# Patient Record
Sex: Female | Born: 1940 | Race: White | Hispanic: No | Marital: Married | Smoking: Never smoker
Health system: Southern US, Community
[De-identification: ages and names within clinical notes are randomized; demographics above are authoritative.]

## PROBLEM LIST (undated history)

## (undated) DIAGNOSIS — I10 Essential (primary) hypertension: Secondary | ICD-10-CM

## (undated) DIAGNOSIS — J45909 Unspecified asthma, uncomplicated: Secondary | ICD-10-CM

## (undated) HISTORY — PX: REPLACEMENT TOTAL KNEE: SUR1224

---

## 2008-08-19 ENCOUNTER — Ambulatory Visit: Payer: Self-pay | Admitting: Diagnostic Radiology

## 2008-08-19 ENCOUNTER — Emergency Department (HOSPITAL_BASED_OUTPATIENT_CLINIC_OR_DEPARTMENT_OTHER): Admission: EM | Admit: 2008-08-19 | Discharge: 2008-08-19 | Payer: Self-pay | Admitting: Emergency Medicine

## 2013-07-11 ENCOUNTER — Encounter (HOSPITAL_BASED_OUTPATIENT_CLINIC_OR_DEPARTMENT_OTHER): Payer: Self-pay | Admitting: Emergency Medicine

## 2013-07-11 ENCOUNTER — Emergency Department (HOSPITAL_BASED_OUTPATIENT_CLINIC_OR_DEPARTMENT_OTHER): Payer: Medicare Other

## 2013-07-11 ENCOUNTER — Emergency Department (HOSPITAL_BASED_OUTPATIENT_CLINIC_OR_DEPARTMENT_OTHER)
Admission: EM | Admit: 2013-07-11 | Discharge: 2013-07-11 | Disposition: A | Discharge status: Home / Self Care | Payer: Medicare Other | Attending: Emergency Medicine | Admitting: Emergency Medicine

## 2013-07-11 DIAGNOSIS — J45901 Unspecified asthma with (acute) exacerbation: Secondary | ICD-10-CM | POA: Insufficient documentation

## 2013-07-11 DIAGNOSIS — Z79899 Other long term (current) drug therapy: Secondary | ICD-10-CM | POA: Insufficient documentation

## 2013-07-11 DIAGNOSIS — J45909 Unspecified asthma, uncomplicated: Secondary | ICD-10-CM

## 2013-07-11 DIAGNOSIS — I10 Essential (primary) hypertension: Secondary | ICD-10-CM | POA: Insufficient documentation

## 2013-07-11 DIAGNOSIS — J189 Pneumonia, unspecified organism: Secondary | ICD-10-CM

## 2013-07-11 DIAGNOSIS — J159 Unspecified bacterial pneumonia: Secondary | ICD-10-CM | POA: Insufficient documentation

## 2013-07-11 DIAGNOSIS — Z880 Allergy status to penicillin: Secondary | ICD-10-CM | POA: Insufficient documentation

## 2013-07-11 HISTORY — DX: Essential (primary) hypertension: I10

## 2013-07-11 HISTORY — DX: Unspecified asthma, uncomplicated: J45.909

## 2013-07-11 MED ORDER — AZITHROMYCIN 250 MG PO TABS
500.0000 mg | ORAL_TABLET | Freq: Once | ORAL | Status: AC
  Administered 2013-07-11: 500 mg via ORAL
  Filled 2013-07-11: qty 2

## 2013-07-11 MED ORDER — IPRATROPIUM-ALBUTEROL 0.5-2.5 (3) MG/3ML IN SOLN
3.0000 mL | Freq: Once | RESPIRATORY_TRACT | Status: AC
Start: 2013-07-11 — End: 2013-07-11
  Administered 2013-07-11: 3 mL via RESPIRATORY_TRACT
  Filled 2013-07-11: qty 3

## 2013-07-11 MED ORDER — AZITHROMYCIN 250 MG PO TABS
250.0000 mg | ORAL_TABLET | Freq: Every day | ORAL | Status: AC
Start: 2013-07-11 — End: ?

## 2013-07-11 MED ORDER — ALBUTEROL SULFATE HFA 108 (90 BASE) MCG/ACT IN AERS
2.0000 | INHALATION_SPRAY | RESPIRATORY_TRACT | Status: DC | PRN
  Administered 2013-07-11: 2 via RESPIRATORY_TRACT
  Filled 2013-07-11: qty 6.7

## 2013-07-11 MED ORDER — ACETAMINOPHEN 325 MG PO TABS
ORAL_TABLET | ORAL | Status: AC
  Administered 2013-07-11: 650 mg
  Filled 2013-07-11: qty 2

## 2013-07-11 MED ORDER — ALBUTEROL SULFATE (2.5 MG/3ML) 0.083% IN NEBU: 5.0000 mg | INHALATION_SOLUTION | Freq: Once | RESPIRATORY_TRACT | Status: DC

## 2013-07-11 MED ORDER — ALBUTEROL SULFATE (2.5 MG/3ML) 0.083% IN NEBU
2.5000 mg | INHALATION_SOLUTION | Freq: Once | RESPIRATORY_TRACT | Status: AC
  Administered 2013-07-11: 2.5 mg via RESPIRATORY_TRACT
  Filled 2013-07-11: qty 3

## 2013-07-11 MED ORDER — IPRATROPIUM BROMIDE 0.02 % IN SOLN: 0.5000 mg | Freq: Once | RESPIRATORY_TRACT | Status: DC

## 2013-07-11 MED ORDER — IPRATROPIUM-ALBUTEROL 0.5-2.5 (3) MG/3ML IN SOLN: 3.0000 mL | RESPIRATORY_TRACT | Status: DC

## 2013-07-11 MED ORDER — PREDNISONE 10 MG PO TABS: 20.0000 mg | ORAL_TABLET | Freq: Every day | ORAL | Status: AC

## 2013-07-11 MED ORDER — AEROCHAMBER PLUS FLO-VU MEDIUM MISC
1.0000 | Freq: Once | Status: AC
  Administered 2013-07-11: 1
  Filled 2013-07-11: qty 1

## 2013-07-11 MED ORDER — PREDNISONE 50 MG PO TABS
60.0000 mg | ORAL_TABLET | Freq: Once | ORAL | Status: AC
  Administered 2013-07-11: 60 mg via ORAL
  Filled 2013-07-11 (×2): qty 1

## 2013-07-11 MED ORDER — ALBUTEROL SULFATE (2.5 MG/3ML) 0.083% IN NEBU
5.0000 mg | INHALATION_SOLUTION | Freq: Once | RESPIRATORY_TRACT | Status: AC
  Administered 2013-07-11: 5 mg via RESPIRATORY_TRACT
  Filled 2013-07-11: qty 6

## 2013-07-11 NOTE — Progress Notes (Signed)
Patient states that she is breathing better post nebulizer treatment but is still having some difficulty taking a deep breath.

## 2013-07-11 NOTE — ED Notes (Signed)
RT at triage to administer treatment

## 2013-07-11 NOTE — ED Provider Notes (Signed)
CSN: [id number]     Arrival date & time 07/11/13  1908 History   First MD Initiated Contact with Patient 07/11/13 2100    This chart was scribed for Hilario Quarry, MD by Ladona Ridgel Day, ED scribe. This patient was seen in room MH05/MH05 and the patient's care was started at 2100.  Chief Complaint  Patient presents with  . Asthma  . Cough   The history is provided by the patient. No language interpreter was used.   HPI Comments: Kristen Rhodes is a 73 y.o. female who presents to the Emergency Department complaining of asthma flare up and cough, onset this AM. She had been using her inhaler at home past several days and (about 3 times/day) and today began w/more wheezing and SOB. She reports associated non-productive cough, mild dyspnea. No sick contacts. No fever/chills, emesis episodes or diarrhea. No recent antibiotic or steroid use. Has never been hospitalized for her asthma.  Past Medical History  Diagnosis Date  . Asthma   . Hypertension    History reviewed. No pertinent past surgical history. No family history on file. History  Substance Use Topics  . Smoking status: Never Smoker   . Smokeless tobacco: Not on file  . Alcohol Use: Not on file   OB History   Grav Para Term Preterm Abortions TAB SAB Ect Mult Living                 Review of Systems  Constitutional: Negative for fever and chills.  Respiratory: Positive for cough, shortness of breath and wheezing.   Cardiovascular: Negative for chest pain.  Gastrointestinal: Negative for abdominal pain.  Musculoskeletal: Negative for back pain.  All other systems reviewed and are negative.   A complete 10 system review of systems was obtained and all systems are negative except as noted in the HPI and PMH.   Allergies  Amoxicillin  Home Medications   Current Outpatient Rx  Name  Route  Sig  Dispense  Refill  . albuterol (PROVENTIL HFA;VENTOLIN HFA) 108 (90 BASE) MCG/ACT inhaler   Inhalation   Inhale into the lungs every  6 (six) hours as needed for wheezing or shortness of breath.         . lisinopril (PRINIVIL,ZESTRIL) 20 MG tablet   Oral   Take 20 mg by mouth daily.         . rosuvastatin (CRESTOR) 40 MG tablet   Oral   Take 40 mg by mouth daily.          Triage Vitals: BP 150/76  Pulse 108  Temp(Src) 97.4 F (36.3 C) (Oral)  Resp 20  Ht 5' 2.5" (1.588 m)  Wt 120 lb (54.432 kg)  BMI 21.59 kg/m2  SpO2 95% Physical Exam  Nursing note and vitals reviewed. Constitutional: She is oriented to person, place, and time. She appears well-developed and well-nourished. No distress.  HENT:  Head: Normocephalic and atraumatic.  Eyes: Conjunctivae are normal. Right eye exhibits no discharge. Left eye exhibits no discharge.  Neck: Normal range of motion.  Cardiovascular: Normal rate, regular rhythm and normal heart sounds.   No murmur heard. Pulmonary/Chest: Effort normal. No respiratory distress. She has wheezes.  Expiratory wheezing  Musculoskeletal: Normal range of motion. She exhibits no edema.  Neurological: She is alert and oriented to person, place, and time.  Skin: Skin is warm and dry.  Psychiatric: She has a normal mood and affect. Thought content normal.   ED Course  Procedures (including critical care time)  DIAGNOSTIC STUDIES: Oxygen Saturation is 98% on room air, normal by my interpretation.    COORDINATION OF CARE: At 900 PM Discussed treatment plan with patient which includes CXR, breathing tx. Patient agrees.   Labs Review Labs Reviewed - No data to display Imaging Review Dg Chest 2 View  07/11/2013   CLINICAL DATA:  Cough and congestion.  Asthma.  Shortness of breath.  EXAM: CHEST  2 VIEW  COMPARISON:  None.  FINDINGS: A mild pectus excavatum deformity. Left glenohumeral joint osteoarthritis. Midline trachea. Normal heart size and mediastinal contours for age. No pleural effusion or pneumothorax. Mild bibasilar volume loss, without lobar consolidation on the lateral view.   IMPRESSION: Bibasilar left greater than right volume loss. Favor atelectasis. Difficult to exclude early left lower lobe pneumonia. If symptoms warrant, consider short term radiographic followup.   Electronically Signed   By: Jeronimo GreavesKyle  Talbot M.D.   On: 07/11/2013 20:26   EKG Interpretation   None      MDM  73 y.o. Female with h.o. Asthma with intermittent wheezing past week. Patient improved here with nebs and prednisone.  CXR with question of early lll pneumonia.  Plan treatment with prednisone and zithromax.  Patient advised close follow up and given return precautions to come back if any worsened dyspnea or chest pain.   Hilario Quarryanielle S Albina Gosney, MD 07/11/13 2249

## 2013-07-11 NOTE — ED Notes (Signed)
Patient here with cough and congestion since am. History of asthma and now wheezing and feels shortness of breath

## 2013-07-11 NOTE — Discharge Instructions (Signed)
Please take all medicine as prescribed.  Return if you are worse at any time especially difficulty breathing or worsening shortness of breath.  You are being treated for a possible early pneumonia seen by the radiologist on your chest x-Abdelrahman Nair and should be rechecked by your doctor in the next one - two days.   Asthma, Adult Asthma is a condition of the lungs in which the airways tighten and narrow. Asthma can make it hard to breathe. Asthma cannot be cured, but medicine and lifestyle changes can help control it. Asthma may be started (triggered) by:  Animal skin flakes (dander).  Dust.  Cockroaches.  Pollen.  Mold.  Smoke.  Cleaning products.  Hair sprays or aerosol sprays.  Paint fumes or strong smells.  Cold air, weather changes, and winds.  Crying or laughing hard.  Stress.  Certain medicines or drugs.  Foods, such as dried fruit, potato chips, and sparkling grape juice.  Infections or conditions (colds, flu).  Exercise.  Certain medical conditions or diseases.  Exercise or tiring activities. HOME CARE   Take medicine as told by your doctor.  Use a peak flow meter as told by your doctor. A peak flow meter is a tool that measures how well the lungs are working.  Record and keep track of the peak flow meter's readings.  Understand and use the asthma action plan. An asthma action plan is a written plan for taking care of your asthma and treating your attacks.  To help prevent asthma attacks:  Do not smoke. Stay away from secondhand smoke.  Change your heating and air conditioning filter often.  Limit your use of fireplaces and wood stoves.  Get rid of pests (such as roaches and mice) and their droppings.  Throw away plants if you see mold on them.  Clean your floors. Dust regularly. Use cleaning products that do not smell.  Have someone vacuum when you are not home. Use a vacuum cleaner with a HEPA filter if possible.  Replace carpet with wood, tile, or  vinyl flooring. Carpet can trap animal skin flakes and dust.  Use allergy-proof pillows, mattress covers, and box spring covers.  Wash bed sheets and blankets every week in hot water and dry them in a dryer.  Use blankets that are made of polyester or cotton.  Clean bathrooms and kitchens with bleach. If possible, have someone repaint the walls in these rooms with mold-resistant paint. Keep out of the rooms that are being cleaned and painted.  Wash hands often. GET HELP IF:  You have make a whistling sound when breaking (wheeze), have shortness of breath, or have a cough even if taking medicine to prevent attacks.  The colored mucus you cough up (sputum) is thicker than usual.  The colored mucus you cough up changes from clear or white to yellow, green, gray, or bloody.  You have problems from the medicine you are taking such as:  A rash.  Itching.  Swelling.  Trouble breathing.  You need reliever medicines more than 2 3 times a week.  Your peak flow measurement is still at 50 79% of your personal best after following the action plan for 1 hour. GET HELP RIGHT AWAY IF:   You seem to be worse and are not responding to medicine during an asthma attack.  You are short of breath even at rest.  You get short of breath when doing very little activity.  You have trouble eating, drinking, or talking.  You have chest pain.  You have a fast heartbeat.  Your lips or fingernails start to turn blue.  You are lightheaded, dizzy, or faint.  Your peak flow is less than 50% of your personal best.  You have a fever or lasting symptoms for more than 2 3 days.  You have a fever and your symptoms suddenly get worse. MAKE SURE YOU:   Understand these instructions.  Will watch your condition.  Will get help right away if you are not doing well or get worse. Document Released: 11/27/2007 Document Revised: 03/31/2013 Document Reviewed: 01/07/2013 [facility] Patient Information  2014 [place], [place].

## 2017-09-29 ENCOUNTER — Encounter (HOSPITAL_BASED_OUTPATIENT_CLINIC_OR_DEPARTMENT_OTHER): Payer: Self-pay | Admitting: *Deleted

## 2017-09-29 ENCOUNTER — Other Ambulatory Visit: Payer: Self-pay

## 2017-09-29 ENCOUNTER — Emergency Department (HOSPITAL_BASED_OUTPATIENT_CLINIC_OR_DEPARTMENT_OTHER)
Admission: EM | Admit: 2017-09-29 | Discharge: 2017-09-30 | Disposition: A | Discharge status: Home / Self Care | Payer: Medicare Other | Attending: Emergency Medicine | Admitting: Emergency Medicine

## 2017-09-29 DIAGNOSIS — J45909 Unspecified asthma, uncomplicated: Secondary | ICD-10-CM | POA: Insufficient documentation

## 2017-09-29 DIAGNOSIS — R350 Frequency of micturition: Secondary | ICD-10-CM | POA: Diagnosis present

## 2017-09-29 DIAGNOSIS — Z96651 Presence of right artificial knee joint: Secondary | ICD-10-CM | POA: Diagnosis not present

## 2017-09-29 DIAGNOSIS — R410 Disorientation, unspecified: Secondary | ICD-10-CM | POA: Diagnosis not present

## 2017-09-29 DIAGNOSIS — I10 Essential (primary) hypertension: Secondary | ICD-10-CM | POA: Diagnosis not present

## 2017-09-29 DIAGNOSIS — Z79899 Other long term (current) drug therapy: Secondary | ICD-10-CM | POA: Insufficient documentation

## 2017-09-29 DIAGNOSIS — N3000 Acute cystitis without hematuria: Secondary | ICD-10-CM

## 2017-09-29 LAB — CBC
HEMATOCRIT: 41.5 % (ref 36.0–46.0)
Hemoglobin: 13.3 g/dL (ref 12.0–15.0)
MCH: 29.9 pg (ref 26.0–34.0)
MCHC: 32 g/dL (ref 30.0–36.0)
MCV: 93.3 fL (ref 78.0–100.0)
PLATELETS: 511 10*3/uL — AB (ref 150–400)
RBC: 4.45 MIL/uL (ref 3.87–5.11)
RDW: 15 % (ref 11.5–15.5)
WBC: 6.3 10*3/uL (ref 4.0–10.5)

## 2017-09-29 LAB — BASIC METABOLIC PANEL
ANION GAP: 10 (ref 5–15)
BUN: 16 mg/dL (ref 6–20)
CHLORIDE: 102 mmol/L (ref 101–111)
CO2: 23 mmol/L (ref 22–32)
CREATININE: 0.74 mg/dL (ref 0.44–1.00)
Calcium: 9.5 mg/dL (ref 8.9–10.3)
GFR calc non Af Amer: >60 mL/min (ref >60)
Glucose, Bld: 121 mg/dL — ABNORMAL HIGH (ref 65–99)
POTASSIUM: 3.7 mmol/L (ref 3.5–5.1)
Sodium: 135 mmol/L (ref 135–145)

## 2017-09-29 LAB — I-STAT CG4 LACTIC ACID, ED: Lactic Acid, Venous: 1.46 mmol/L (ref 0.5–1.9)

## 2017-09-29 LAB — CBG MONITORING, ED: Glucose-Capillary: 116 mg/dL — ABNORMAL HIGH (ref 65–99)

## 2017-09-29 MED ORDER — SODIUM CHLORIDE 0.9 % IV BOLUS
500.0000 mL | Freq: Once | INTRAVENOUS | Status: AC
  Administered 2017-09-30: 500 mL via INTRAVENOUS

## 2017-09-29 MED ORDER — OXYCODONE-ACETAMINOPHEN 5-325 MG PO TABS
1.0000 | ORAL_TABLET | Freq: Once | ORAL | Status: AC
  Administered 2017-09-30: 1 via ORAL
  Filled 2017-09-29: qty 1

## 2017-09-29 NOTE — ED Notes (Signed)
Attempted to give urine sample in BR. Pt unable to void at this time. Pt assisted back to bed and resting comfortably at this time. Family at bedside

## 2017-09-29 NOTE — ED Provider Notes (Signed)
[facility] EMERGENCY DEPARTMENT Provider Note   CSN: [id number] Arrival date & time: 09/29/17  2027     History   Chief Complaint Chief Complaint  Patient presents with  . Weakness    HPI Kristen Rhodes is a 77 y.o. female.  Patient reports that she has had generalized weakness, confusion and urinary frequency.  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  Since the surgery she has had difficulty rebounding.  She has had generalized weakness and has not been able to do things she normally does.  Over the last couple of days she has noticed that she has had urinary incontinence and frequency.  She has had intermittent cramping pain in her low central abdomen.  She has not had diarrhea but did have nausea and vomited once.  She has not had any fever at home.  Patient reports that the abdominal pain is not currently present, she is complaining of right knee pain.  This is consistent with the pain she has been experiencing after the surgery.  She has not taken any of her pain pills today because she does not like taking pain medicine.  She has not noticed any skin color change, warmth of the knee.  She has not had any recent injury.  Patient denies cough, chest congestion.  She has not experienced any chest pain or shortness of breath.  Husband reports that she seemed confused and had difficulty answering questions earlier today but this has also improved.     Past Medical History:  Diagnosis Date  . Asthma   . Hypertension     There are no active problems to display for this patient.   Past Surgical History:  Procedure Laterality Date  . REPLACEMENT TOTAL KNEE       OB History   None      Home Medications    Prior to Admission medications   Medication Sig Start Date End Date Taking? Authorizing Provider  HYDROcodone-acetaminophen (NORCO) 10-325 MG tablet Take 1 tablet by mouth every 6 (six) hours as needed.   Yes [provider]  albuterol (PROVENTIL HFA;VENTOLIN HFA) 108 (90 BASE) MCG/ACT inhaler Inhale into the lungs every 6 (six) hours as needed for wheezing or shortness of breath.    [provider]  azithromycin (ZITHROMAX Z-PAK) 250 MG tablet Take 1 tablet (250 mg total) by mouth daily. 07/11/13   Margarita Grizzleay, Danielle, MD  cephALEXin (KEFLEX) 500 MG capsule Take 1 capsule (500 mg total) by mouth 2 (two) times daily. 09/30/17   Gilda CreasePollina, Christopher J, MD  lisinopril (PRINIVIL,ZESTRIL) 20 MG tablet Take 20 mg by mouth daily.    [provider]  predniSONE (DELTASONE) 10 MG tablet Take 2 tablets (20 mg total) by mouth daily. 07/11/13   Margarita Grizzleay, Danielle, MD  rosuvastatin (CRESTOR) 40 MG tablet Take 40 mg by mouth daily.    [provider]    Family History No family history on file.  Social History Social History   Tobacco Use  . Smoking status: Never Smoker  . Smokeless tobacco: Never Used  Substance Use Topics  . Alcohol use: Not on file  . Drug use: Not on file     Allergies   Amoxicillin   Review of Systems Review of Systems  Constitutional: Positive for fatigue. Negative for fever.  Respiratory: Negative for shortness of breath.   Cardiovascular: Negative for chest pain.  Genitourinary: Positive for frequency.  Musculoskeletal: Positive for arthralgias.  Psychiatric/Behavioral: Positive for confusion.  All other systems reviewed and are negative.    Physical Exam Updated Vital Signs BP (!) 110/94   Pulse 89   Temp 98.7 F (37.1 C) (Oral)   Resp 17   Ht 5\' 2"  (1.575 m)   Wt 52.6 kg (116 lb)   SpO2 97%   BMI 21.22 kg/m   Physical Exam  Constitutional: She is oriented to person, place, and time. She appears well-developed and well-nourished. No distress.  HENT:  Head: Normocephalic and atraumatic.  Right Ear: Hearing normal.  Left Ear: Hearing normal.  Nose: Nose normal.  Mouth/Throat: Oropharynx is clear and moist and mucous membranes are normal.    Eyes: Pupils are equal, round, and reactive to light. Conjunctivae and EOM are normal.  Neck: Normal range of motion. Neck supple.  Cardiovascular: Regular rhythm, S1 normal and S2 normal. Exam reveals no gallop and no friction rub.  No murmur heard. Pulmonary/Chest: Effort normal and breath sounds normal. No respiratory distress. She exhibits no tenderness.  Abdominal: Soft. Normal appearance and bowel sounds are normal. There is no hepatosplenomegaly. There is no tenderness. There is no rebound, no guarding, no tenderness at McBurney's point and negative Murphy's sign. No hernia.  Musculoskeletal: Normal range of motion.       Right knee: She exhibits swelling. She exhibits no ecchymosis and no erythema. Tenderness found.  Anterior midline incision intact, NO erythema, warmth, induration, lymphangitis  Neurological: She is alert and oriented to person, place, and time. She has normal strength. No cranial nerve deficit or sensory deficit. Coordination normal. GCS eye subscore is 4. GCS verbal subscore is 5. GCS motor subscore is 6.  Skin: Skin is warm, dry and intact. No rash noted. No cyanosis.  Psychiatric: She has a normal mood and affect. Her speech is normal and behavior is normal. Thought content normal.  Nursing note and vitals reviewed.    ED Treatments / Results  Labs (all labs ordered are listed, but only abnormal results are displayed) Labs Reviewed  BASIC METABOLIC PANEL - Abnormal; Notable for the following components:      Result Value   Glucose, Bld 121 (*)    All other components within normal limits  CBC - Abnormal; Notable for the following components:   Platelets 511 (*)    All other components within normal limits  URINALYSIS, ROUTINE W REFLEX MICROSCOPIC - Abnormal; Notable for the following components:   Hgb urine dipstick TRACE (*)    Ketones, ur 15 (*)    Leukocytes, UA SMALL (*)    All other components within normal limits  URINALYSIS, MICROSCOPIC (REFLEX)  - Abnormal; Notable for the following components:   Bacteria, UA FEW (*)    Squamous Epithelial / LPF 0-5 (*)    All other components within normal limits  CBG MONITORING, ED - Abnormal; Notable for the following components:   Glucose-Capillary 116 (*)    All other components within normal limits  URINE CULTURE  I-STAT CG4 LACTIC ACID, ED  I-STAT CG4 LACTIC ACID, ED    EKG None  Radiology Dg Chest 2 View  Result Date: 09/30/2017 CLINICAL DATA:  Weakness.  Knee pain. EXAM: CHEST - 2 VIEW COMPARISON:  Radiographs 08/27/2017 FINDINGS: The cardiomediastinal contours are unchanged, mild cardiomegaly is similar to prior. The lungs are clear. Pulmonary vasculature is normal. No consolidation, pleural effusion, or pneumothorax. Skin fold projects over the left chest. No acute osseous abnormalities are seen. Chronic degenerative change of the shoulders. IMPRESSION:  Stable cardiomegaly without acute abnormality. Electronically Signed   By: Rubye Oaks M.D.   On: 09/30/2017 01:50   Ct Head Wo Contrast  Result Date: 09/30/2017 CLINICAL DATA:  77 y/o F; knee replacement 3 weeks ago. Altered level of consciousness, unexplained. EXAM: CT HEAD WITHOUT CONTRAST TECHNIQUE: Contiguous axial images were obtained from the base of the skull through the vertex without intravenous contrast. COMPARISON:  None. FINDINGS: Brain: No evidence of acute infarction, hemorrhage, hydrocephalus, extra-axial collection or mass lesion/mass effect. 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 Vascular: No hyperdense vessel or unexpected calcification. Skull: Normal. Negative for fracture or focal lesion. Sinuses/Orbits: No acute finding. Other: Bilateral macrophthalmia. IMPRESSION: 1. No acute intracranial abnormality identified. 2. Small left chronic pontine infarct. 3. Bilateral macrophthalmia. Electronically Signed   By: Mitzi Hansen M.D.   On: 09/30/2017 02:01   Dg Knee Complete 4 Views  Right  Result Date: 09/30/2017 CLINICAL DATA:  Knee pain. Knee replacement 3 weeks ago. Increasing pain below patella. EXAM: RIGHT KNEE - COMPLETE 4+ VIEW COMPARISON:  None. FINDINGS: Right knee arthroplasty in expected alignment. No periprosthetic lucency or fracture. There is been patellar resurfacing. Small knee joint effusion. No periosteal reaction or bony destructive change. IMPRESSION: Right knee arthroplasty without evidence of complication or acute abnormality. Small joint effusion. Electronically Signed   By: Rubye Oaks M.D.   On: 09/30/2017 01:52    Procedures Procedures (including critical care time)  Medications Ordered in ED Medications  cefTRIAXone (ROCEPHIN) 1 g in sodium chloride 0.9 % 100 mL IVPB (has no administration in time range)  sodium chloride 0.9 % bolus 500 mL (500 mLs Intravenous New Bag/Given 09/30/17 0005)  oxyCODONE-acetaminophen (PERCOCET/ROXICET) 5-325 MG per tablet 1 tablet (1 tablet Oral Given 09/30/17 0001)     Initial Impression / Assessment and Plan / ED Course  I have reviewed the triage vital signs and the nursing notes.  Pertinent labs & imaging results that were available during my care of the patient were reviewed by me and considered in my medical decision making (see chart for details).     Patient presents to the ER for evaluation of generalized weakness.  Patient reports that she has been experiencing urinary incontinence, urinary frequency and dysuria.  Husband was concerned that she was acting strangely and seemed confused earlier, but she is back to her baseline now.  Head CT was negative.  Workup is unremarkable other than convincing evidence for urinary tract infection.  She does not have a leukocytosis, she is afebrile.  All of her blood work was unremarkable.  A urine culture has been sent and she is initiated on Rocephin.  She has not hypotensive, tachycardic.  Lactic acid is normal, no signs of sepsis at this time.  She is appropriate  for continued outpatient management of UTI with Keflex after IV dose of Rocephin here in the ER.  Final Clinical Impressions(s) / ED Diagnoses   Final diagnoses:  Acute cystitis without hematuria    ED Discharge Orders        Ordered    cephALEXin (KEFLEX) 500 MG capsule  2 times daily     09/30/17 0230       Gilda Crease, MD 09/30/17 0230

## 2017-09-29 NOTE — ED Notes (Signed)
Unable to obtain urine at this time.

## 2017-09-29 NOTE — ED Triage Notes (Signed)
Knee replacement surgery 3 weeks ago. Has not felt well since the surgery. Weakness. She has seen 2 doctors about the symptoms but continues to get worse. She has been constipated. She vomited x 1 a week ago. Symptoms started after she started taking pain medication.

## 2017-09-29 NOTE — ED Notes (Signed)
Unable to provide urine sample at this time

## 2017-09-29 NOTE — ED Notes (Signed)
Pt. Reports she had knee surgery and since then she is not eating or drinking well.  Pt. Is at times slow with her thought process but no s/s of neurological issues.  Pt. Is in no distress.

## 2017-09-30 ENCOUNTER — Emergency Department (HOSPITAL_BASED_OUTPATIENT_CLINIC_OR_DEPARTMENT_OTHER): Payer: Medicare Other

## 2017-09-30 DIAGNOSIS — N3000 Acute cystitis without hematuria: Secondary | ICD-10-CM | POA: Diagnosis not present

## 2017-09-30 LAB — URINALYSIS, MICROSCOPIC (REFLEX)

## 2017-09-30 LAB — URINALYSIS, ROUTINE W REFLEX MICROSCOPIC
Bilirubin Urine: NEGATIVE
GLUCOSE, UA: NEGATIVE mg/dL
Ketones, ur: 15 mg/dL — AB
Nitrite: NEGATIVE
Protein, ur: NEGATIVE mg/dL
Specific Gravity, Urine: 1.02 (ref 1.005–1.030)
pH: 6 (ref 5.0–8.0)

## 2017-09-30 MED ORDER — SODIUM CHLORIDE 0.9 % IV SOLN
1.0000 g | Freq: Once | INTRAVENOUS | Status: AC
  Administered 2017-09-30: 1 g via INTRAVENOUS
  Filled 2017-09-30: qty 10

## 2017-09-30 MED ORDER — CEPHALEXIN 500 MG PO CAPS: 500.0000 mg | ORAL_CAPSULE | Freq: Two times a day (BID) | ORAL | 0 refills | Status: AC

## 2017-09-30 NOTE — ED Notes (Signed)
Patient transported to X-ray & CT °

## 2017-10-01 LAB — URINE CULTURE

## 2019-05-20 IMAGING — DX DG KNEE COMPLETE 4+V*R*
4 series · 4 of 4 positions shown · non-contrast
Comparison: None.

CLINICAL DATA: Knee pain. Knee replacement 3 weeks ago. Increasing
pain below patella.

EXAM:
RIGHT KNEE - COMPLETE 4+ VIEW

[knee ap]
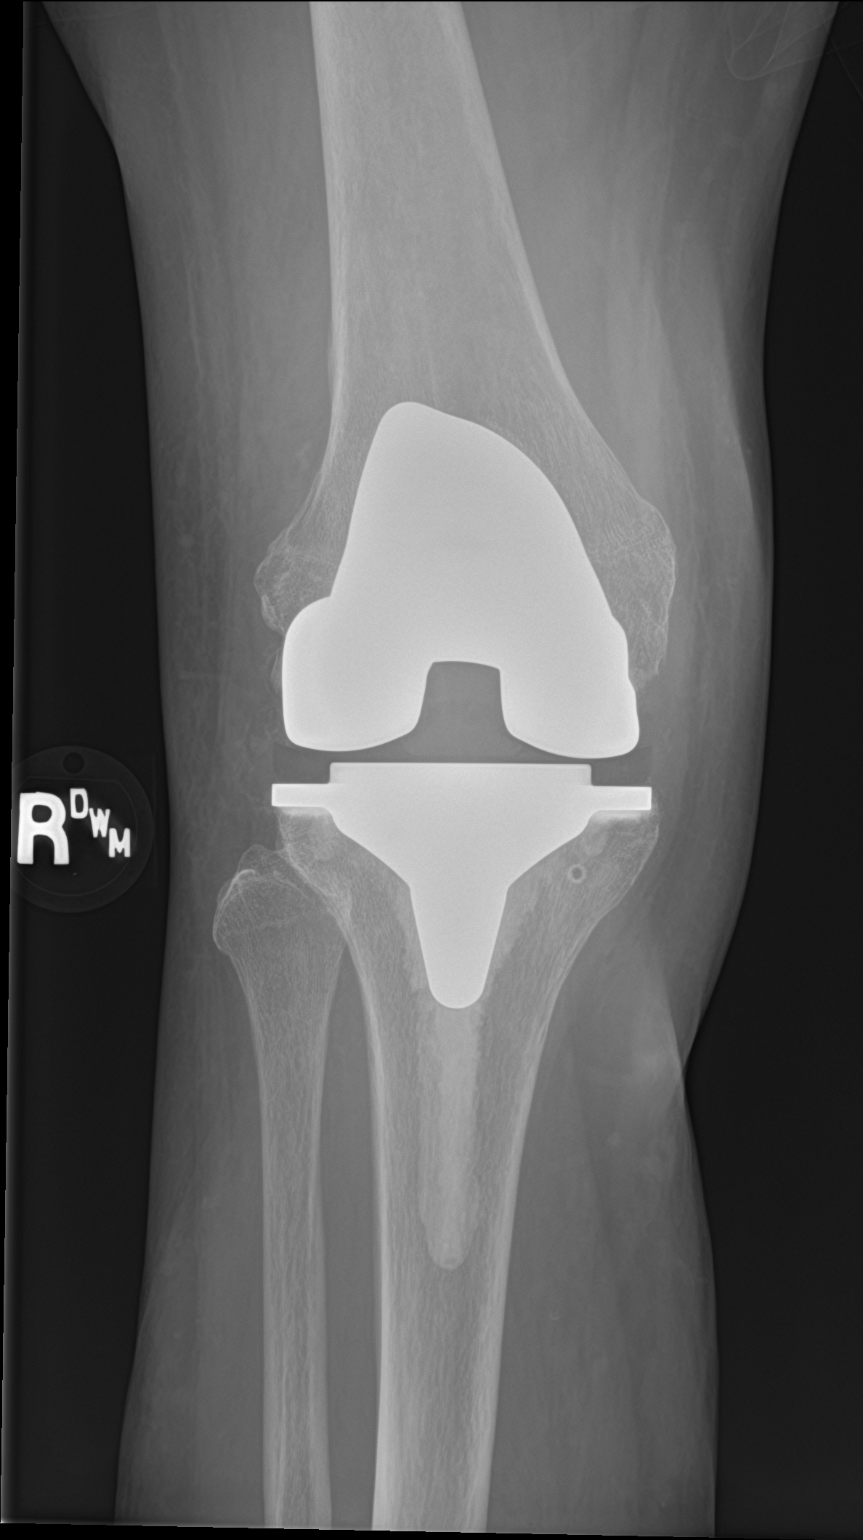

[knee lat]
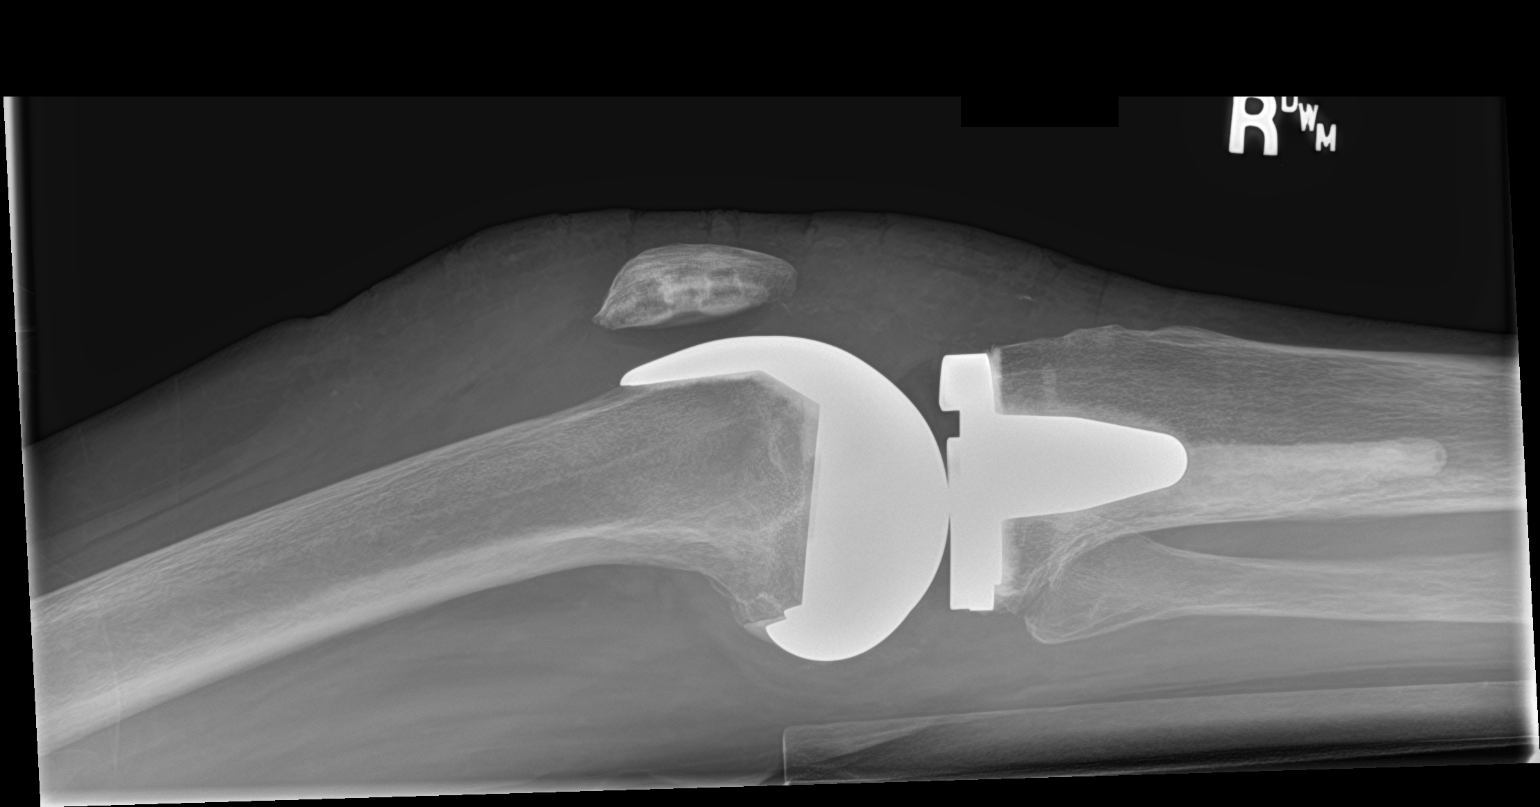

[knee obl (1 of 2)]
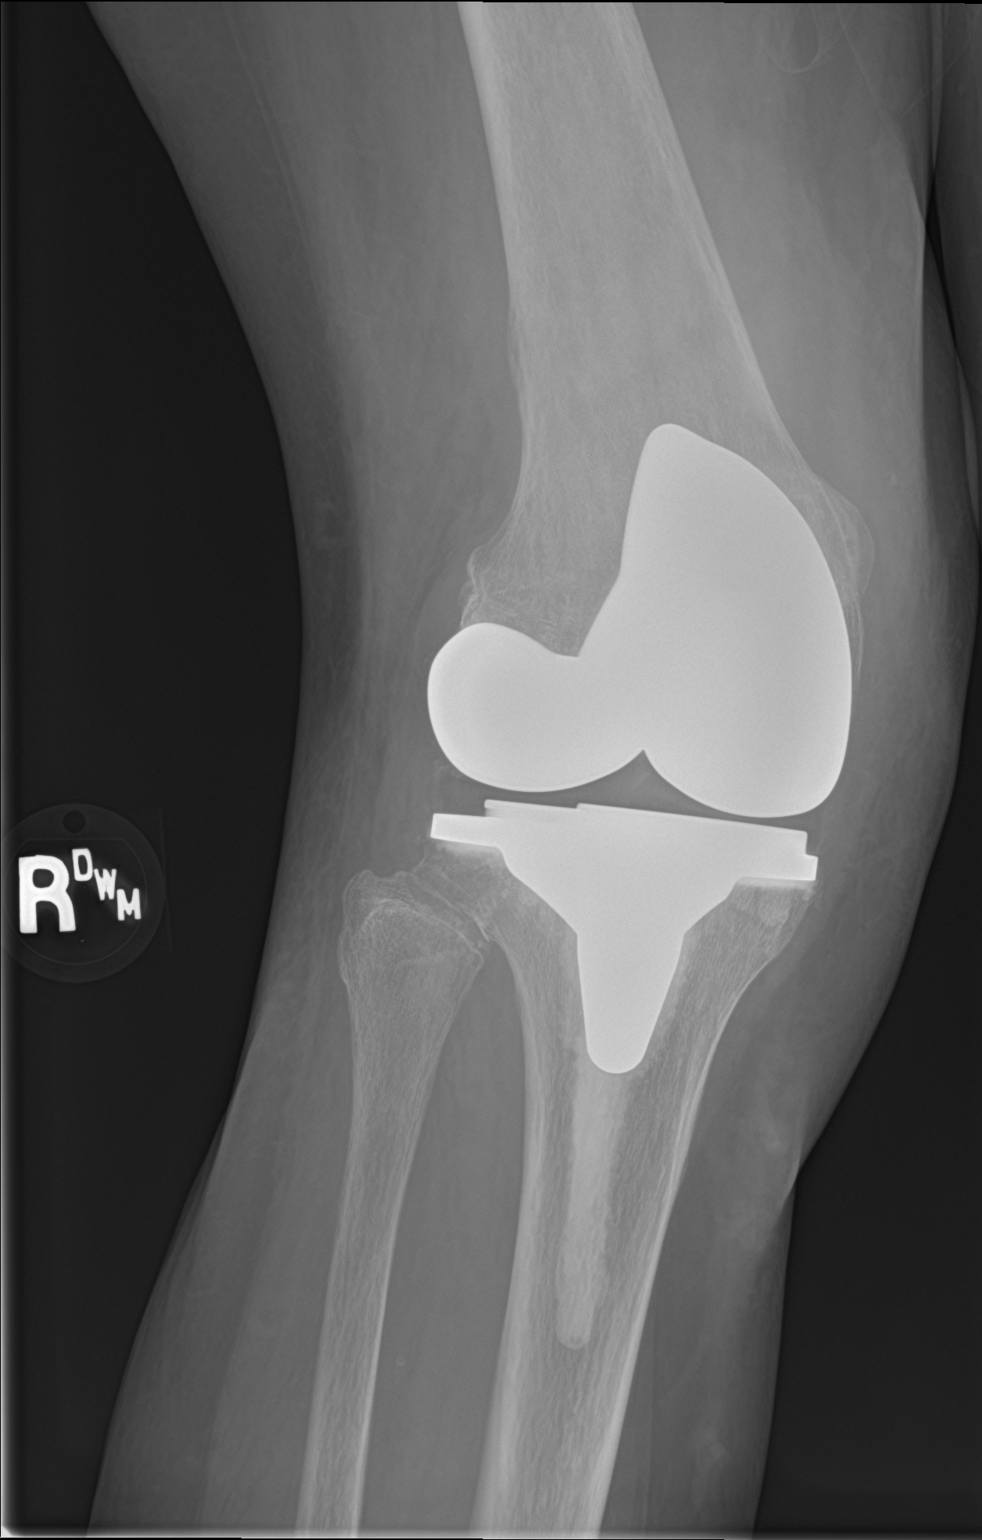

[knee obl (2 of 2)]
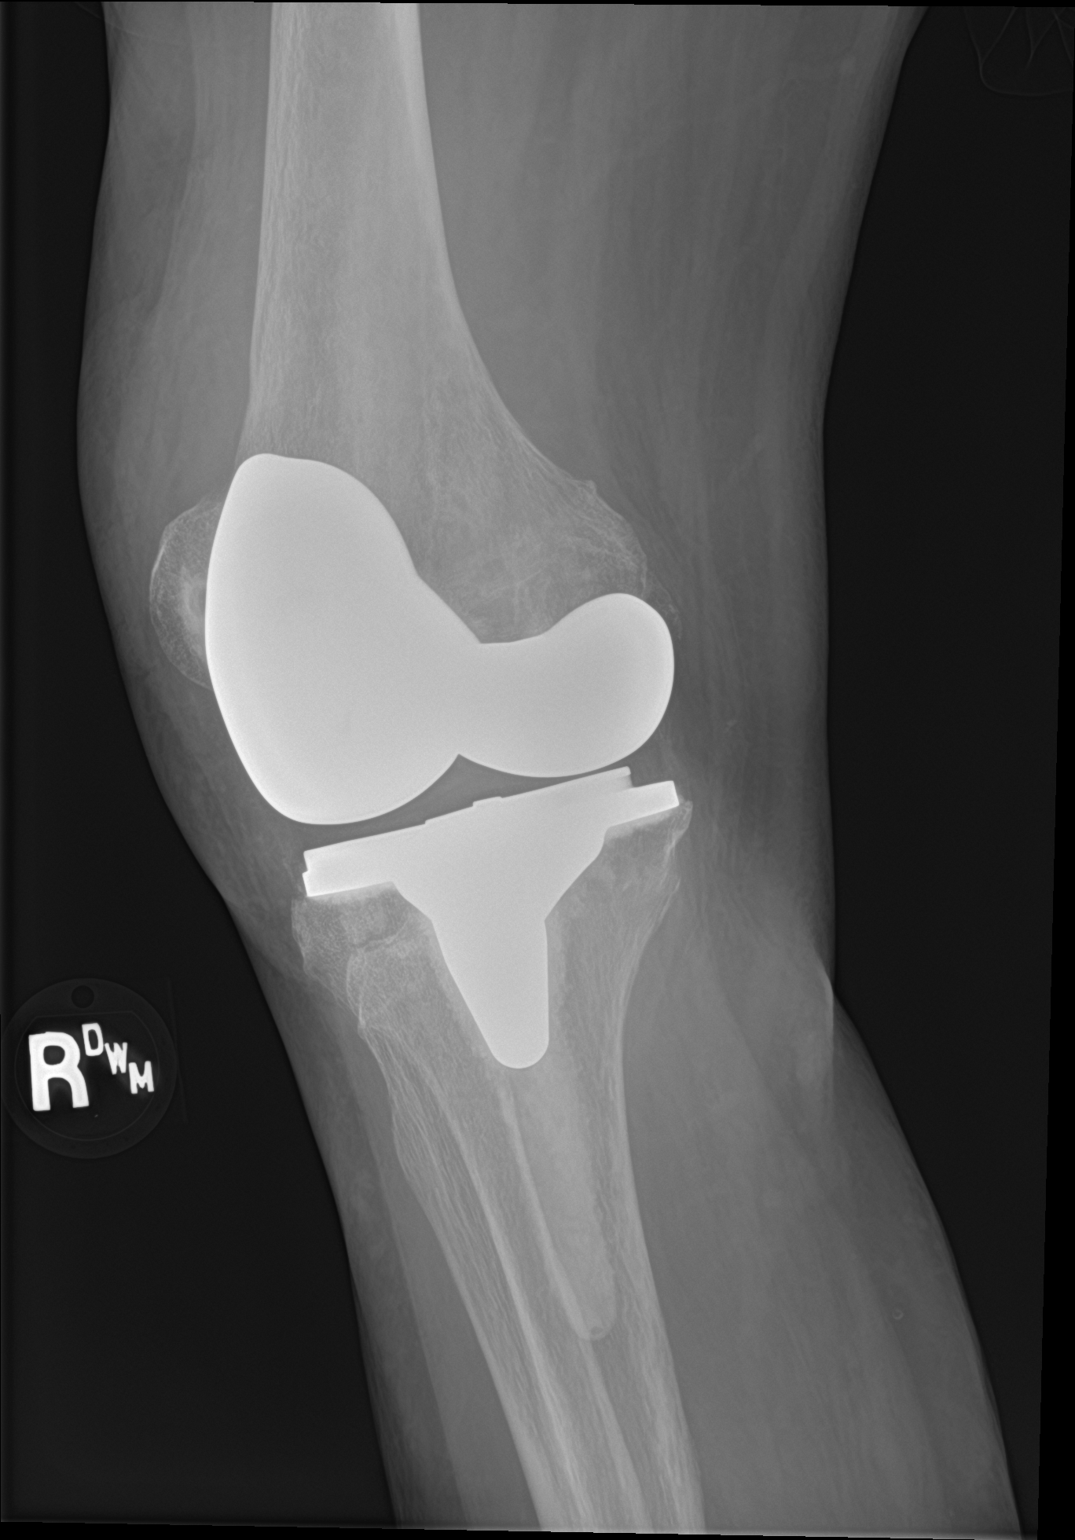

[4 of 4 positions shown; findings below may reference images not displayed]

FINDINGS: Right knee arthroplasty in expected alignment. No periprosthetic
lucency or fracture. There is been patellar resurfacing. Small knee
joint effusion. No periosteal reaction or bony destructive change.
IMPRESSION: Right knee arthroplasty without evidence of complication or acute
abnormality.

Small joint effusion.

## 2019-05-20 IMAGING — DX DG CHEST 2V
2 series · 2 of 2 positions shown · non-contrast
Comparison: Radiographs 08/27/2017

CLINICAL DATA: Weakness.  Knee pain.

EXAM:
CHEST - 2 VIEW

[chest lat]
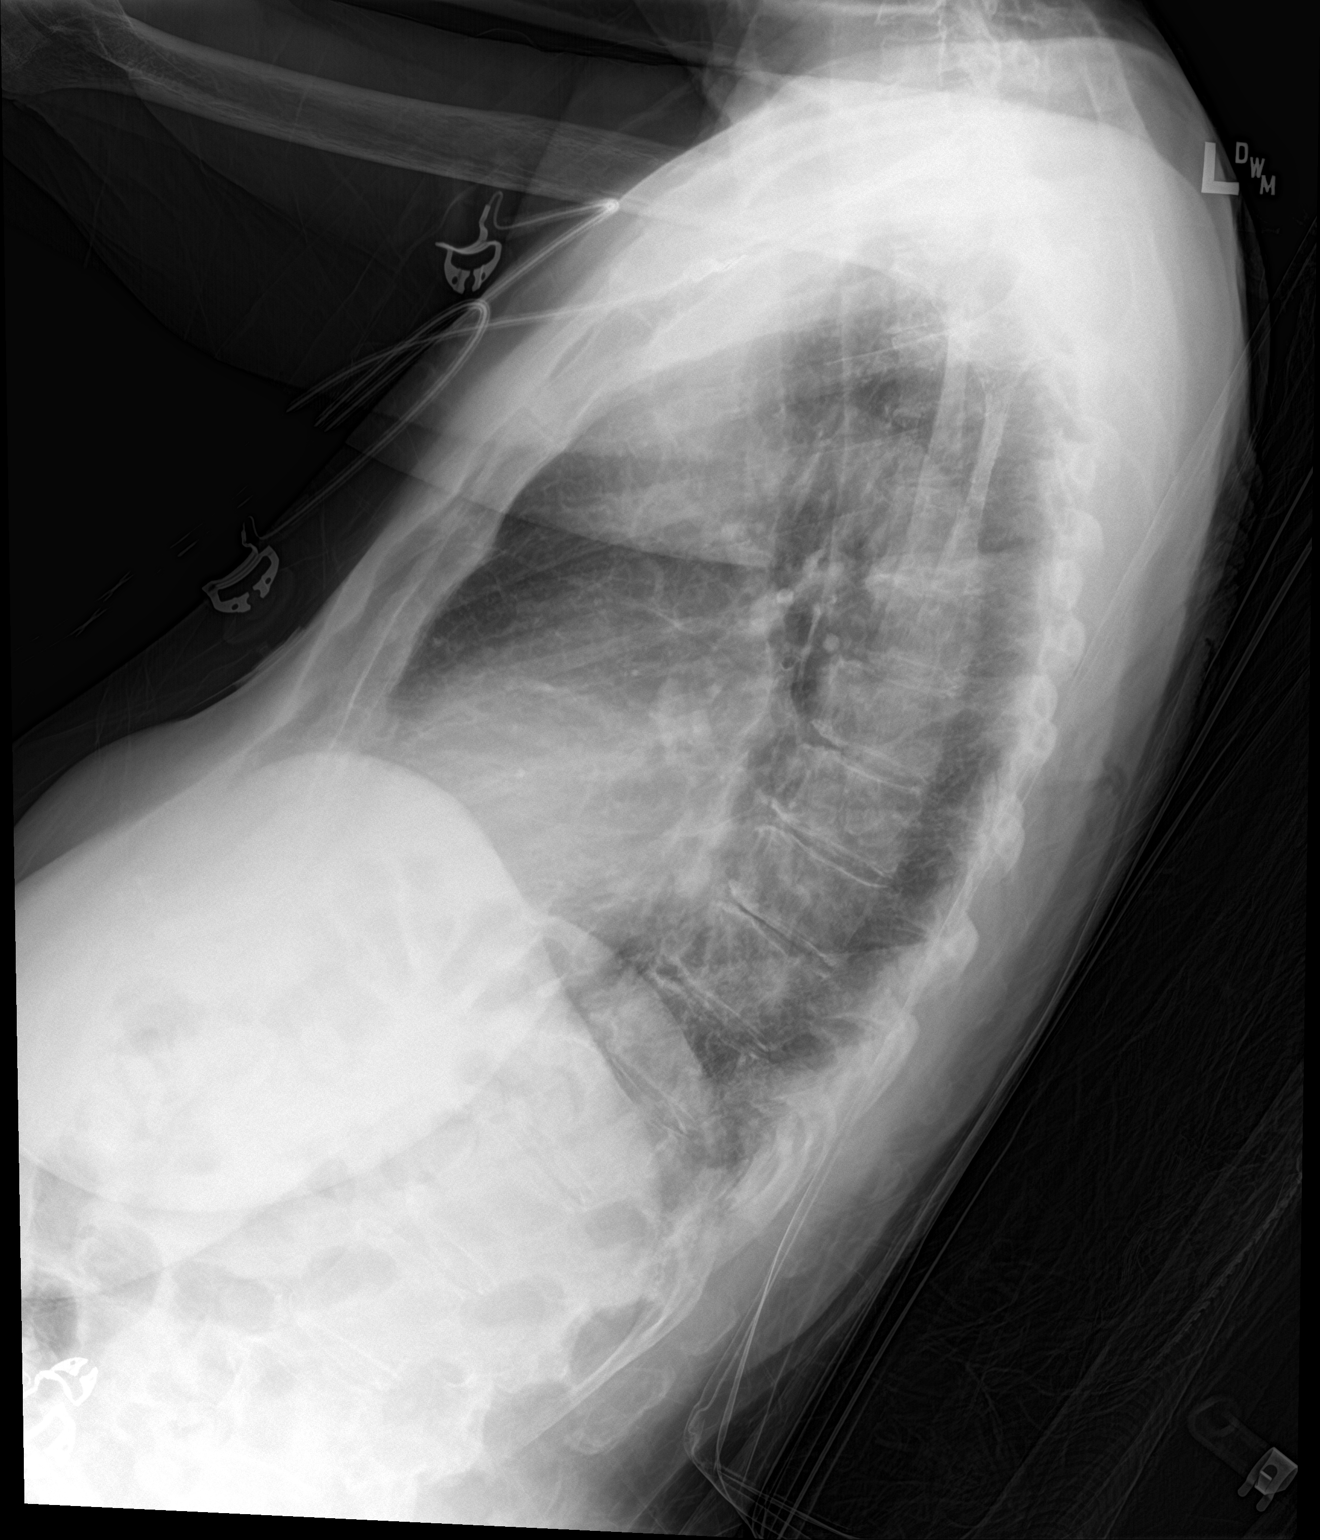

[chest ap strecther]
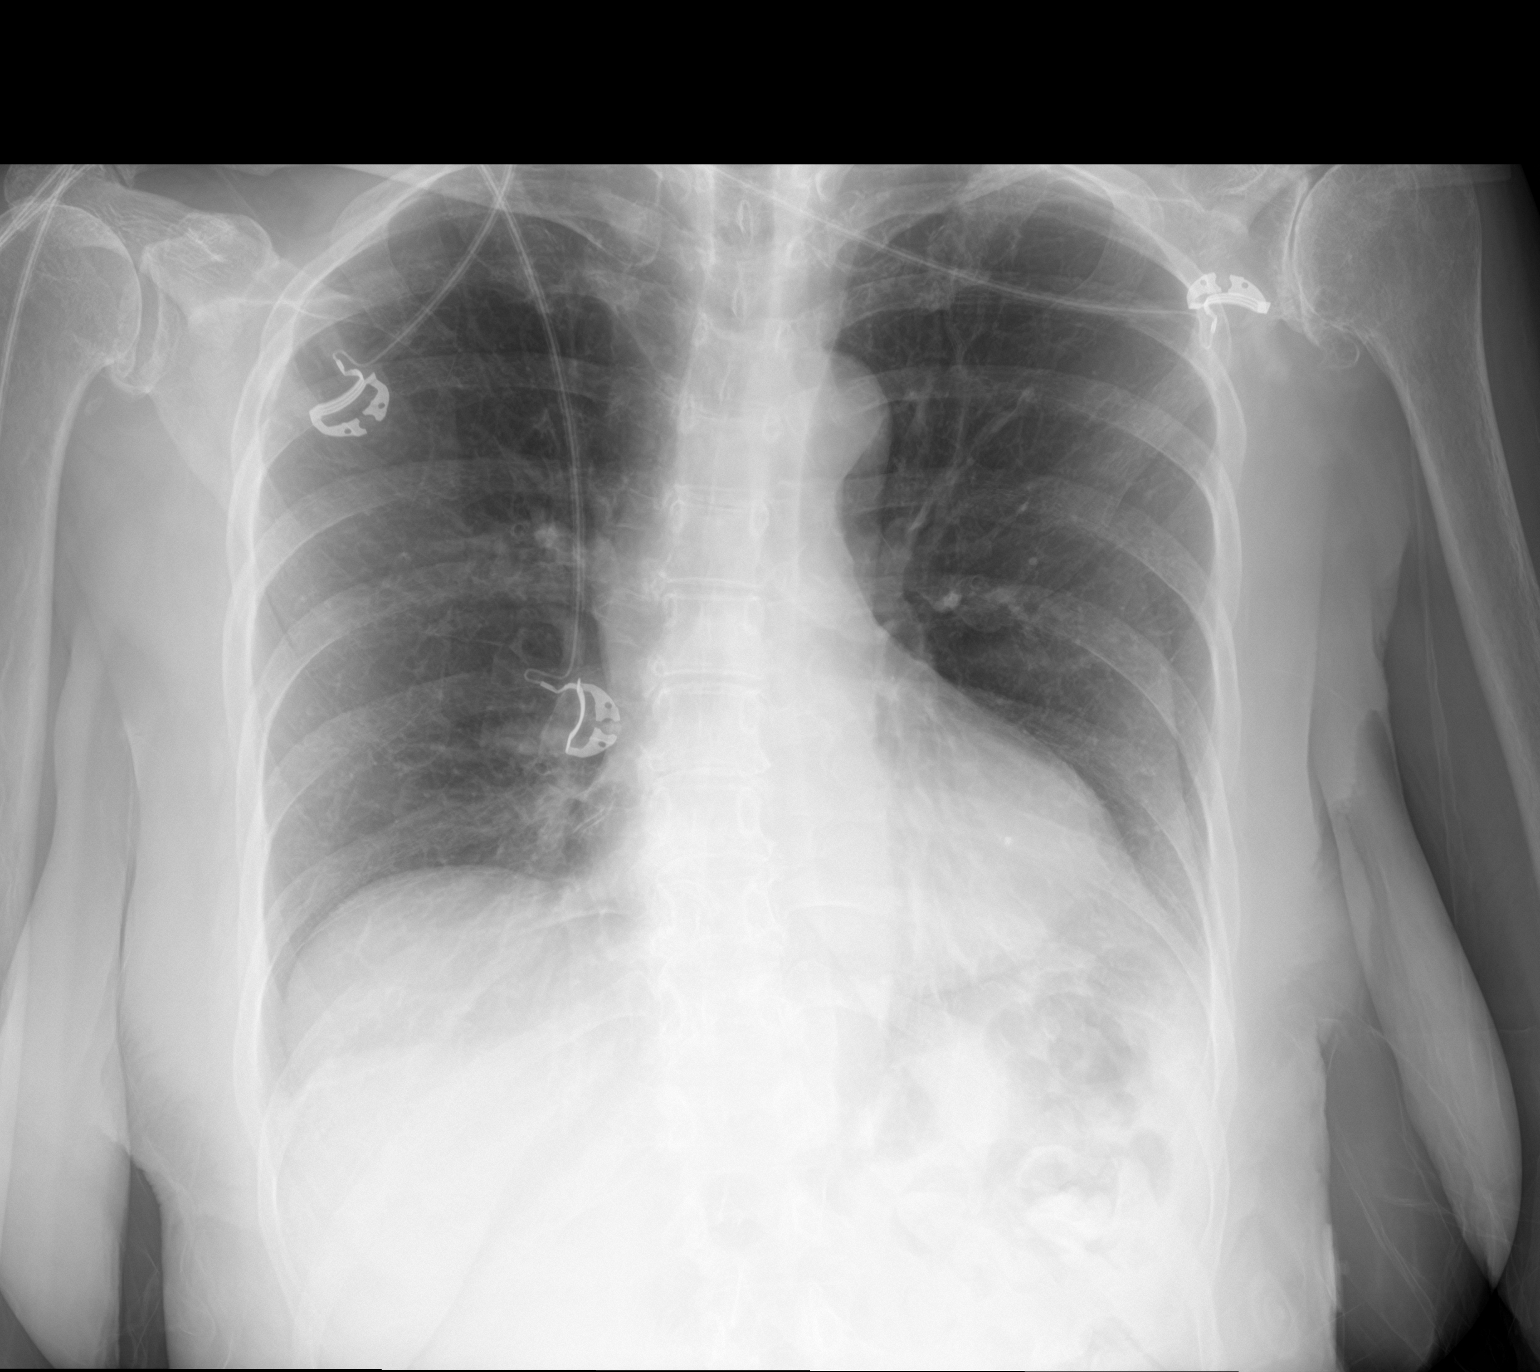

[2 of 2 positions shown; findings below may reference images not displayed]

FINDINGS: The cardiomediastinal contours are unchanged, mild cardiomegaly is
similar to prior. The lungs are clear. Pulmonary vasculature is
normal. No consolidation, pleural effusion, or pneumothorax. Skin
fold projects over the left chest. No acute osseous abnormalities
are seen. Chronic degenerative change of the shoulders.
IMPRESSION: Stable cardiomegaly without acute abnormality.
